# Patient Record
Sex: Male | Born: 1965 | Race: White | Hispanic: No | Marital: Single | Smoking: Never smoker
Health system: Southern US, Community
[De-identification: ages and names within clinical notes are randomized; demographics above are authoritative.]

## PROBLEM LIST (undated history)

## (undated) DIAGNOSIS — N200 Calculus of kidney: Secondary | ICD-10-CM

## (undated) DIAGNOSIS — R51 Headache: Secondary | ICD-10-CM

## (undated) DIAGNOSIS — J45909 Unspecified asthma, uncomplicated: Secondary | ICD-10-CM

## (undated) DIAGNOSIS — F32A Depression, unspecified: Secondary | ICD-10-CM

## (undated) DIAGNOSIS — C419 Malignant neoplasm of bone and articular cartilage, unspecified: Secondary | ICD-10-CM

## (undated) DIAGNOSIS — F419 Anxiety disorder, unspecified: Secondary | ICD-10-CM

## (undated) DIAGNOSIS — Z85830 Personal history of malignant neoplasm of bone: Secondary | ICD-10-CM

## (undated) DIAGNOSIS — R519 Headache, unspecified: Secondary | ICD-10-CM

## (undated) DIAGNOSIS — F329 Major depressive disorder, single episode, unspecified: Secondary | ICD-10-CM

## (undated) HISTORY — PX: LITHOTRIPSY: SUR834

## (undated) HISTORY — DX: Personal history of malignant neoplasm of bone: Z85.830

---

## 2004-12-25 DIAGNOSIS — C419 Malignant neoplasm of bone and articular cartilage, unspecified: Secondary | ICD-10-CM

## 2004-12-25 HISTORY — PX: APPENDECTOMY: SHX54

## 2004-12-25 HISTORY — DX: Malignant neoplasm of bone and articular cartilage, unspecified: C41.9

## 2007-06-17 ENCOUNTER — Emergency Department (HOSPITAL_COMMUNITY): Admission: EM | Admit: 2007-06-17 | Discharge: 2007-06-17 | Payer: Self-pay | Admitting: Emergency Medicine

## 2008-09-08 ENCOUNTER — Emergency Department (HOSPITAL_BASED_OUTPATIENT_CLINIC_OR_DEPARTMENT_OTHER): Admission: EM | Admit: 2008-09-08 | Discharge: 2008-09-08 | Payer: Self-pay | Admitting: Emergency Medicine

## 2008-10-26 ENCOUNTER — Emergency Department (HOSPITAL_BASED_OUTPATIENT_CLINIC_OR_DEPARTMENT_OTHER): Admission: EM | Admit: 2008-10-26 | Discharge: 2008-10-26 | Payer: Self-pay | Admitting: Emergency Medicine

## 2009-06-17 ENCOUNTER — Ambulatory Visit: Payer: Self-pay | Admitting: Diagnostic Radiology

## 2009-06-17 ENCOUNTER — Emergency Department (HOSPITAL_BASED_OUTPATIENT_CLINIC_OR_DEPARTMENT_OTHER): Admission: EM | Admit: 2009-06-17 | Discharge: 2009-06-17 | Payer: Self-pay | Admitting: Emergency Medicine

## 2009-09-04 ENCOUNTER — Emergency Department (HOSPITAL_BASED_OUTPATIENT_CLINIC_OR_DEPARTMENT_OTHER): Admission: EM | Admit: 2009-09-04 | Discharge: 2009-09-04 | Payer: Self-pay | Admitting: Emergency Medicine

## 2009-10-17 ENCOUNTER — Emergency Department (HOSPITAL_BASED_OUTPATIENT_CLINIC_OR_DEPARTMENT_OTHER): Admission: EM | Admit: 2009-10-17 | Discharge: 2009-10-17 | Payer: Self-pay | Admitting: Emergency Medicine

## 2009-10-17 ENCOUNTER — Ambulatory Visit: Payer: Self-pay | Admitting: Interventional Radiology

## 2009-12-25 DIAGNOSIS — N200 Calculus of kidney: Secondary | ICD-10-CM

## 2009-12-25 HISTORY — DX: Calculus of kidney: N20.0

## 2010-11-30 ENCOUNTER — Emergency Department (HOSPITAL_BASED_OUTPATIENT_CLINIC_OR_DEPARTMENT_OTHER)
Admission: EM | Admit: 2010-11-30 | Discharge: 2010-11-30 | Payer: Self-pay | Source: Home / Self Care | Admitting: Emergency Medicine

## 2010-12-08 ENCOUNTER — Ambulatory Visit (HOSPITAL_COMMUNITY)
Admission: RE | Admit: 2010-12-08 | Discharge: 2010-12-08 | Payer: Self-pay | Source: Home / Self Care | Attending: Urology | Admitting: Urology

## 2010-12-16 ENCOUNTER — Emergency Department (HOSPITAL_BASED_OUTPATIENT_CLINIC_OR_DEPARTMENT_OTHER)
Admission: EM | Admit: 2010-12-16 | Discharge: 2010-12-17 | Payer: Self-pay | Source: Home / Self Care | Admitting: Physician Assistant

## 2011-03-07 LAB — BASIC METABOLIC PANEL
BUN: 22 mg/dL (ref 6–23)
Creatinine, Ser: 1 mg/dL (ref 0.4–1.5)
Glucose, Bld: 149 mg/dL — ABNORMAL HIGH (ref 70–99)
Potassium: 4.2 mEq/L (ref 3.5–5.1)
Sodium: 144 mEq/L (ref 135–145)

## 2011-03-07 LAB — URINALYSIS, ROUTINE W REFLEX MICROSCOPIC
Ketones, ur: 15 mg/dL — AB
Protein, ur: 100 mg/dL — AB
Specific Gravity, Urine: 1.027 (ref 1.005–1.030)
Urobilinogen, UA: 0.2 mg/dL (ref 0.0–1.0)

## 2011-03-07 LAB — DIFFERENTIAL
Eosinophils Absolute: 0.1 10*3/uL (ref 0.0–0.7)
Eosinophils Relative: 1 % (ref 0–5)
Lymphocytes Relative: 12 % (ref 12–46)
Monocytes Absolute: 0.5 10*3/uL (ref 0.1–1.0)
Monocytes Relative: 5 % (ref 3–12)
Neutrophils Relative %: 82 % — ABNORMAL HIGH (ref 43–77)

## 2011-03-07 LAB — CBC
MCH: 29.5 pg (ref 26.0–34.0)
MCHC: 36.3 g/dL — ABNORMAL HIGH (ref 30.0–36.0)
Platelets: 252 10*3/uL (ref 150–400)

## 2011-03-07 LAB — URINE CULTURE
Colony Count: NO GROWTH
Culture: NO GROWTH

## 2011-03-07 LAB — URINE MICROSCOPIC-ADD ON

## 2011-03-30 LAB — DIFFERENTIAL
Eosinophils Absolute: 0.2 10*3/uL (ref 0.0–0.7)
Eosinophils Relative: 3 % (ref 0–5)
Lymphs Abs: 2.4 10*3/uL (ref 0.7–4.0)
Monocytes Relative: 6 % (ref 3–12)
Neutrophils Relative %: 58 % (ref 43–77)

## 2011-03-30 LAB — BASIC METABOLIC PANEL
BUN: 15 mg/dL (ref 6–23)
CO2: 23 mEq/L (ref 19–32)
Creatinine, Ser: 0.8 mg/dL (ref 0.4–1.5)
Glucose, Bld: 130 mg/dL — ABNORMAL HIGH (ref 70–99)
Potassium: 3.9 mEq/L (ref 3.5–5.1)

## 2011-03-30 LAB — CBC
MCHC: 34.3 g/dL (ref 30.0–36.0)
Platelets: 252 10*3/uL (ref 150–400)
RDW: 11.9 % (ref 11.5–15.5)

## 2011-04-12 ENCOUNTER — Emergency Department (INDEPENDENT_AMBULATORY_CARE_PROVIDER_SITE_OTHER): Payer: BC Managed Care – PPO

## 2011-04-12 ENCOUNTER — Emergency Department (HOSPITAL_BASED_OUTPATIENT_CLINIC_OR_DEPARTMENT_OTHER)
Admission: EM | Admit: 2011-04-12 | Discharge: 2011-04-12 | Disposition: A | Discharge status: Home / Self Care | Payer: BC Managed Care – PPO | Attending: Emergency Medicine | Admitting: Emergency Medicine

## 2011-04-12 DIAGNOSIS — R079 Chest pain, unspecified: Secondary | ICD-10-CM

## 2011-04-12 DIAGNOSIS — K219 Gastro-esophageal reflux disease without esophagitis: Secondary | ICD-10-CM | POA: Insufficient documentation

## 2011-04-12 LAB — BASIC METABOLIC PANEL
BUN: 16 mg/dL (ref 6–23)
CO2: 23 mEq/L (ref 19–32)
Chloride: 109 mEq/L (ref 96–112)
Creatinine, Ser: 0.8 mg/dL (ref 0.4–1.5)
GFR calc Af Amer: >60 mL/min (ref >60)
GFR calc non Af Amer: >60 mL/min (ref >60)

## 2011-04-12 LAB — POCT CARDIAC MARKERS
CKMB, poc: <1 ng/mL — ABNORMAL LOW (ref 1.0–8.0)
CKMB, poc: <1 ng/mL — ABNORMAL LOW (ref 1.0–8.0)
Myoglobin, poc: 38.9 ng/mL (ref 12–200)

## 2011-04-12 LAB — DIFFERENTIAL
Basophils Absolute: 0.1 10*3/uL (ref 0.0–0.1)
Basophils Relative: 1 % (ref 0–1)
Lymphs Abs: 2.5 10*3/uL (ref 0.7–4.0)
Monocytes Absolute: 0.6 10*3/uL (ref 0.1–1.0)
Monocytes Relative: 9 % (ref 3–12)
Neutro Abs: 3.4 10*3/uL (ref 1.7–7.7)
Neutrophils Relative %: 48 % (ref 43–77)

## 2011-04-12 LAB — CBC
HCT: 45.9 % (ref 39.0–52.0)
MCHC: 35.3 g/dL (ref 30.0–36.0)
MCV: 82.3 fL (ref 78.0–100.0)
WBC: 7.1 10*3/uL (ref 4.0–10.5)

## 2011-10-11 LAB — I-STAT 8, (EC8 V) (CONVERTED LAB)
Acid-base deficit: 1
Bicarbonate: 24.8 — ABNORMAL HIGH
HCT: 51
Operator id: 285841
TCO2: 26
pCO2, Ven: 42.3 — ABNORMAL LOW

## 2011-10-11 LAB — POCT CARDIAC MARKERS
CKMB, poc: <1 — ABNORMAL LOW
Troponin i, poc: <0.05

## 2011-10-11 LAB — POCT I-STAT CREATININE: Creatinine, Ser: 1

## 2014-05-01 ENCOUNTER — Emergency Department (HOSPITAL_COMMUNITY): Payer: BC Managed Care – PPO

## 2014-05-01 ENCOUNTER — Emergency Department (HOSPITAL_COMMUNITY)
Admission: EM | Admit: 2014-05-01 | Discharge: 2014-05-02 | Disposition: A | Discharge status: Home / Self Care | Payer: BC Managed Care – PPO | Attending: Emergency Medicine | Admitting: Emergency Medicine

## 2014-05-01 ENCOUNTER — Encounter (HOSPITAL_COMMUNITY): Payer: Self-pay | Admitting: Emergency Medicine

## 2014-05-01 DIAGNOSIS — Z85830 Personal history of malignant neoplasm of bone: Secondary | ICD-10-CM | POA: Insufficient documentation

## 2014-05-01 DIAGNOSIS — S46909A Unspecified injury of unspecified muscle, fascia and tendon at shoulder and upper arm level, unspecified arm, initial encounter: Secondary | ICD-10-CM | POA: Insufficient documentation

## 2014-05-01 DIAGNOSIS — S7010XA Contusion of unspecified thigh, initial encounter: Secondary | ICD-10-CM | POA: Insufficient documentation

## 2014-05-01 DIAGNOSIS — Z87442 Personal history of urinary calculi: Secondary | ICD-10-CM | POA: Insufficient documentation

## 2014-05-01 DIAGNOSIS — Y92410 Unspecified street and highway as the place of occurrence of the external cause: Secondary | ICD-10-CM | POA: Insufficient documentation

## 2014-05-01 DIAGNOSIS — Y9389 Activity, other specified: Secondary | ICD-10-CM | POA: Insufficient documentation

## 2014-05-01 DIAGNOSIS — R091 Pleurisy: Secondary | ICD-10-CM | POA: Insufficient documentation

## 2014-05-01 DIAGNOSIS — S4980XA Other specified injuries of shoulder and upper arm, unspecified arm, initial encounter: Secondary | ICD-10-CM | POA: Insufficient documentation

## 2014-05-01 HISTORY — DX: Malignant neoplasm of bone and articular cartilage, unspecified: C41.9

## 2014-05-01 HISTORY — DX: Calculus of kidney: N20.0

## 2014-05-01 NOTE — ED Notes (Signed)
Patient ambulatory from triage back to room--NAD at this time CXR and right femur imaging already completed
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
## 2014-05-02 LAB — I-STAT TROPONIN, ED: Troponin i, poc: 0 ng/mL (ref 0.00–0.08)

## 2014-05-02 LAB — D-DIMER, QUANTITATIVE: D-Dimer, Quant: <0.27 ug/mL-FEU (ref 0.00–0.48)

## 2014-05-02 NOTE — Discharge Instructions (Signed)
Please return to the ER if your chest pain or shortness of breath worsens.  In the meantime, Take ibuprofen with food, up to 800mg  three times a day, as needed for pain.  Apply a heating pad or ice pack.  Avoid activities that aggravate pain.     Pleurisy Pleurisy is an inflammation and swelling of the lining of the lungs (pleura). Because of this inflammation, it hurts to breathe. It can be aggravated by coughing, laughing, or deep breathing. Pleurisy is often caused by an underlying infection or disease.  HOME CARE INSTRUCTIONS  Monitor your pleurisy for any changes. The following actions may help to alleviate any discomfort you are experiencing:  Medicine may help with pain. Only take over-the-counter or prescription medicines for pain, discomfort, or fever as directed by your health care provider.  Only take antibiotic medicine as directed. Make sure to finish it even if you start to feel better. SEEK MEDICAL CARE IF:   Your pain is not controlled with medicine or is increasing.  You have an increase in pus-like (purulent) secretions brought up with coughing. SEEK IMMEDIATE MEDICAL CARE IF:   You have blue or dark lips, fingernails, or toenails.  You are coughing up blood.  You have increased difficulty breathing.  You have continuing pain unrelieved by medicine or pain lasting more than 1 week.  You have pain that radiates into your neck, arms, or jaw.  You develop increased shortness of breath or wheezing.  You develop a fever, rash, vomiting, fainting, or other serious symptoms. MAKE SURE YOU:  Understand these instructions.   Will watch your condition.   Will get help right away if you are not doing well or get worse.  Document Released: 12/11/2005 Document Revised: 08/13/2013 Document Reviewed: 05/25/2013 [facility] Patient Information 2014 [place].
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
## 2014-05-02 NOTE — ED Provider Notes (Signed)
Medical screening examination/treatment/procedure(s) were performed by non-physician practitioner and as supervising physician I was immediately available for consultation/collaboration.   EKG Interpretation   Date/Time:  Saturday May 02 2014 01:23:57 EDT Ventricular Rate:  54 PR Interval:  144 QRS Duration: 86 QT Interval:  424 QTC Calculation: 402 R Axis:   83 Text Interpretation:  Sinus bradycardia ST elev, probable normal early  repol pattern Rate is slower Confirmed by Florina Ou  MD, Jenny Reichmann ([id number]) on  05/02/2014 1:57:41 AM        Wynetta Fines, MD 05/02/14 [phone]

## 2015-01-09 IMAGING — CR DG CHEST 2V
2 series · 2 of 2 positions shown · non-contrast
Comparison: 04/12/2011

CLINICAL DATA: Fall.

EXAM:
CHEST  2 VIEW

[w chest pa]
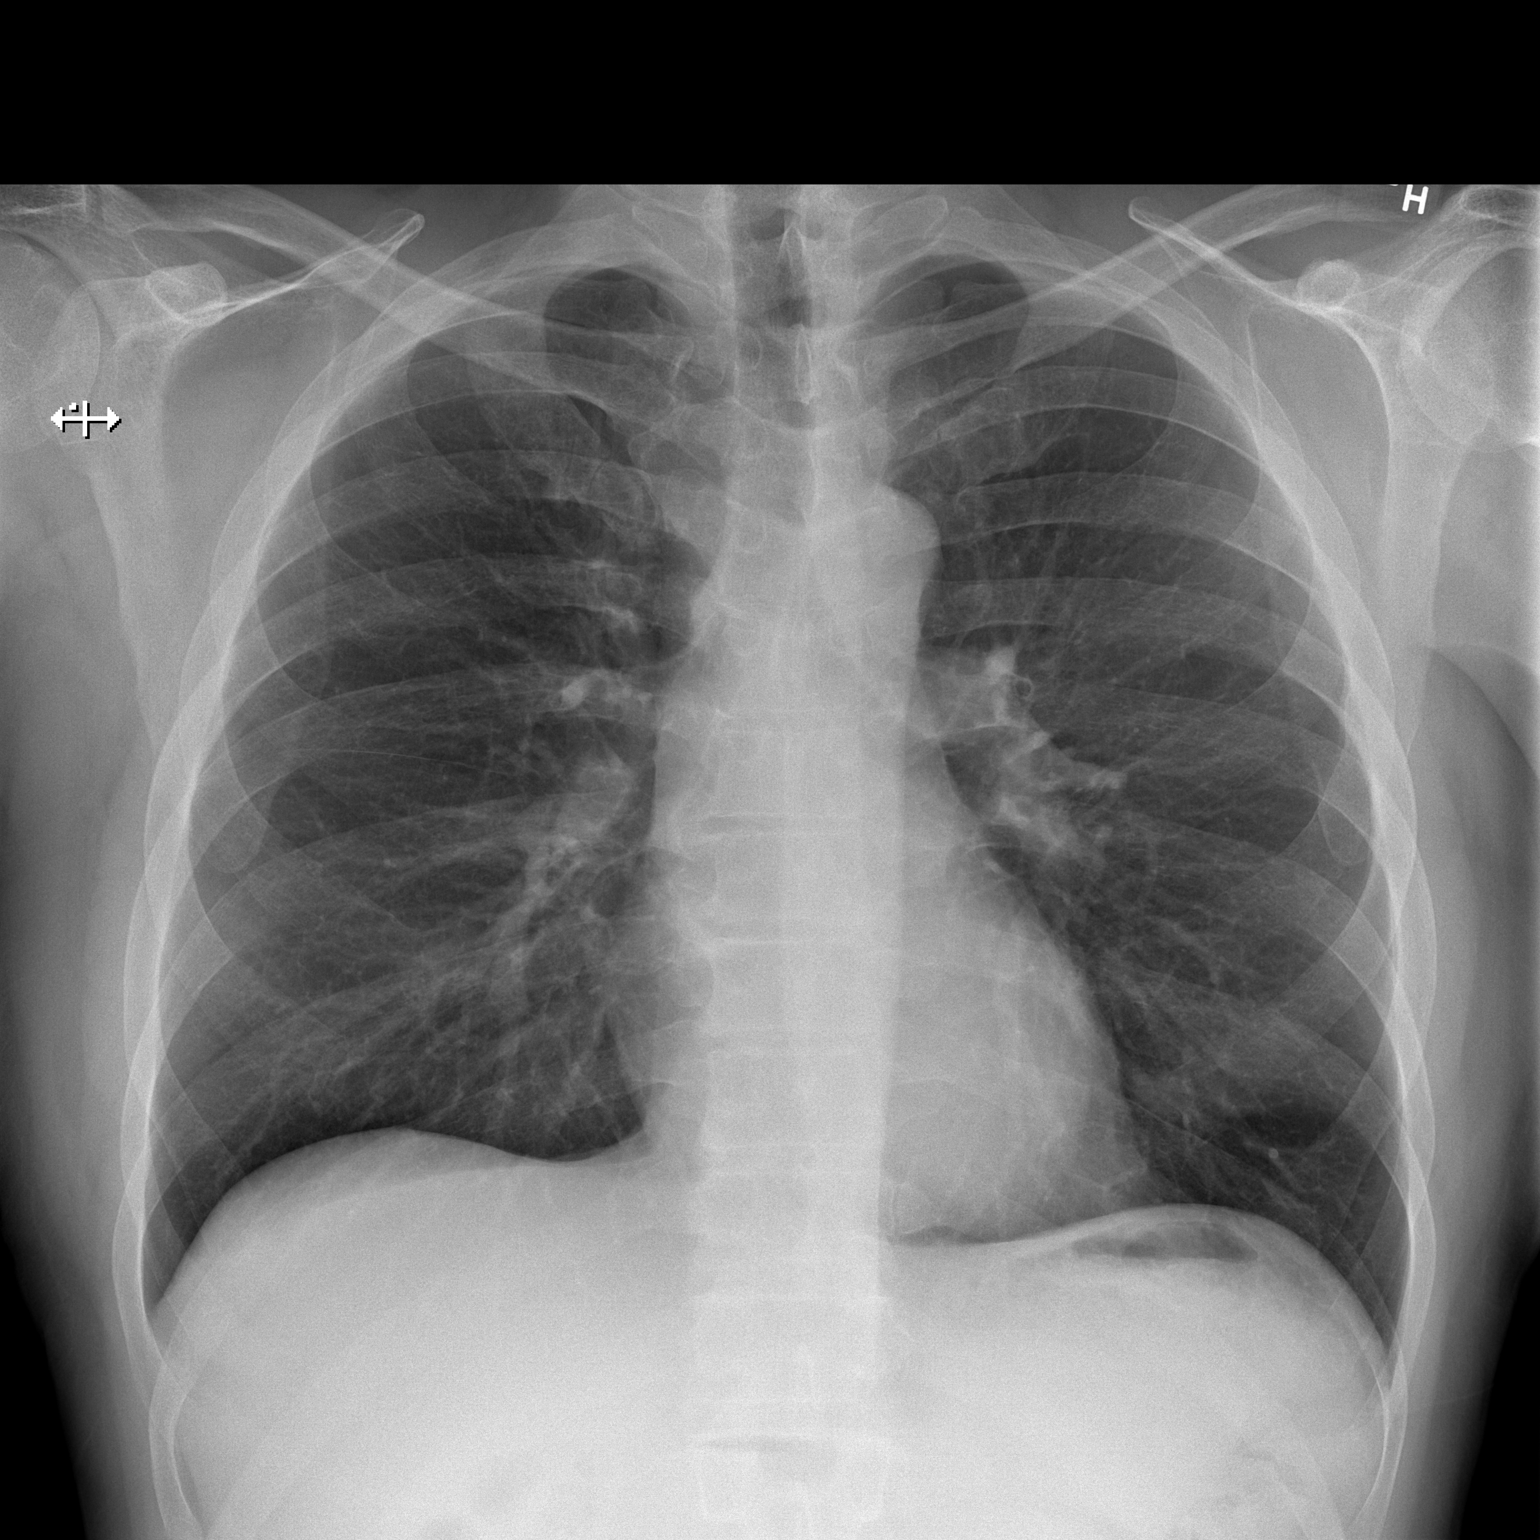

[w chest lat]
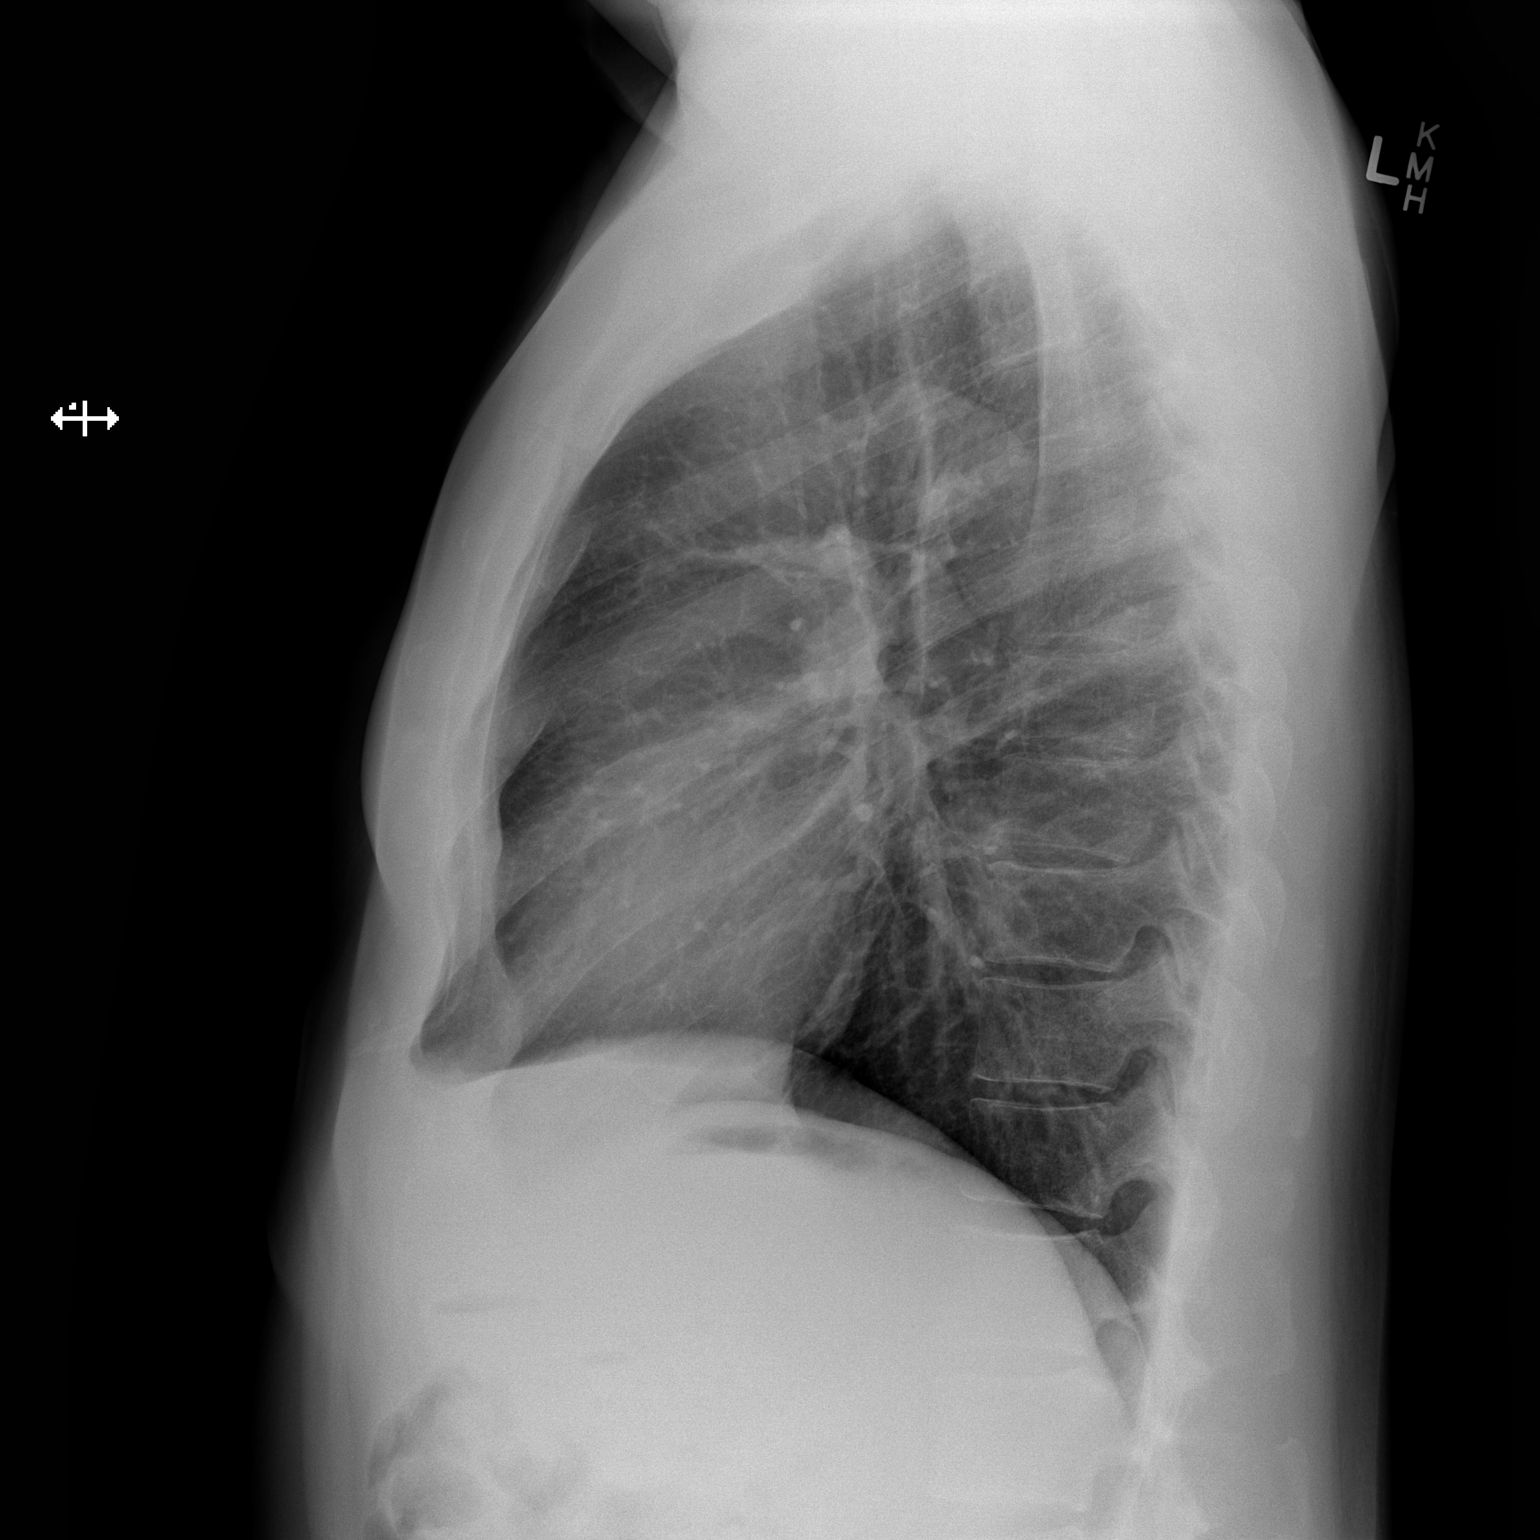

[2 of 2 positions shown; findings below may reference images not displayed]

FINDINGS: Normal heart size and mediastinal contours. No acute infiltrate or
edema. No effusion or pneumothorax. No acute osseous findings.
IMPRESSION: No active cardiopulmonary disease.

## 2015-03-15 ENCOUNTER — Other Ambulatory Visit: Payer: Self-pay | Admitting: Urology

## 2015-03-16 ENCOUNTER — Encounter (HOSPITAL_COMMUNITY): Payer: Self-pay | Admitting: *Deleted

## 2015-03-17 NOTE — H&P (Signed)
Chief Complaint Left flank pain and gross hematuria   History of Present Illness Jeffery Simpson is a 49 year old gentleman seen today as a new patient to me for a one-month history of left-sided flank pain with radiation to his left lower quadrant and left testis. This pain symptoms have been mild to moderate and controlled with ibuprofen. The pain episodes have been intermittent. They do feel fairly similar to his prior kidney stone episodes. He also has noted associated gross hematuria. He denies significant nausea or vomiting. He denies fever.    He has a history of calcium oxalate urolithiasis and previously has undergone ureteroscopy and shockwave lithotripsy for treatment. He also has spontaneously passed stones.   Past Medical History Problems  1. History of Asthma (J45.909) 2. History of Chondrosarcoma Of The Bone 3. History of esophageal reflux (Z87.19) 4. History of kidney stones (Q94.765)  Surgical History Problems  1. History of Appendectomy 2. History of Cystoscopy With Manipulation Of Ureteral Calculus 3. History of Lithotripsy 4. History of Orthopedic Surgery  Current Meds 1. BusPIRone HCl - 7.5 MG Oral Tablet;  Therapy: (Recorded:18Mar2016) to Recorded 2. Flexeril TABS (Cyclobenzaprine HCl);  Therapy: (Recorded:18Mar2016) to Recorded 3. Zoloft 100 MG Oral Tablet (Sertraline HCl);  Therapy: (Recorded:18Mar2016) to Recorded  Allergies Medication  1. Ciprofloxacin HCl TABS 2. Morphine Derivatives 3. Percocet TABS 4. Vicodin TABS  Family History Problems  1. Family history of malignant neoplasm of uterus (Z80.49) : Mother 2. Family history of Nephrolithiasis : Father 3. Family history of Prostate Cancer : Father  Social History Problems    Denied: History of Alcohol Use   Marital History - Single   Never a smoker   Occupation:   Marketing/Public relations   Denied: History of Tobacco Use  Review of Systems Genitourinary, constitutional, skin, eye,  otolaryngeal, hematologic/lymphatic, cardiovascular, pulmonary, endocrine, musculoskeletal, gastrointestinal, neurological and psychiatric system(s) were reviewed and pertinent findings if present are noted and are otherwise negative.  Gastrointestinal: no vomiting.  Constitutional: feeling tired (fatigue).  Musculoskeletal: back pain.  Neurological: headache and dizziness.  Psychiatric: anxiety and depression.    Vitals Vital Signs [Data Includes: Last 1 Day]  Recorded: [id number] 03:42PM  Height: 6 ft 2 in Weight: 194 lb  BMI Calculated: 24.91 BSA Calculated: 2.15 Blood Pressure: 157 / 104 Temperature: 98.2 F Heart Rate: 62  Physical Exam Constitutional: Well nourished and well developed . No acute distress.  ENT:. The ears and nose are normal in appearance.  Neck: The appearance of the neck is normal and no neck mass is present.  Pulmonary: No respiratory distress and normal respiratory rhythm and effort.  Cardiovascular: Heart rate and rhythm are normal . No peripheral edema.  Abdomen: The abdomen is soft and nontender. No masses are palpated. No CVA tenderness. No hernias are palpable. No hepatosplenomegaly noted.  Lymphatics: The femoral and inguinal nodes are not enlarged or tender.  Skin: Normal skin turgor, no visible rash and no visible skin lesions.  Neuro/Psych:. Mood and affect are appropriate.    Results/Data Urine [Data Includes: Last 1 Day]   [id number]  COLOR YELLOW   APPEARANCE CLEAR   SPECIFIC GRAVITY 1.025   pH 6.0   GLUCOSE NEG mg/dL  BILIRUBIN NEG   KETONE NEG mg/dL  BLOOD LARGE   PROTEIN NEG mg/dL  UROBILINOGEN 0.2 mg/dL  NITRITE NEG   LEUKOCYTE ESTERASE NEG   SQUAMOUS EPITHELIAL/HPF NONE SEEN   WBC NONE SEEN WBC/hpf  RBC 11-20 RBC/hpf  BACTERIA NONE SEEN   CRYSTALS NONE SEEN  CASTS NONE SEEN   Selected Results  AU CT-STONE PROTOCOL [id number] 12:00AM Raynelle Bring   Test Name Result Flag Reference  CT-STONE PROTOCOL (Report)    **  RADIOLOGY REPORT BY [facility] **   CLINICAL DATA: 49 year old male with gross and microscopic hematuria for 1 week.  EXAM: CT ABDOMEN AND PELVIS WITHOUT CONTRAST  TECHNIQUE: Multidetector CT imaging of the abdomen and pelvis was performed following the standard protocol without IV contrast.  COMPARISON: 11/30/2010 CT  FINDINGS: Please note that parenchymal abnormalities may be missed without intravenous contrast.  Hepatobiliary: The visualized liver and gallbladder are unremarkable. There is no evidence of biliary dilatation.  Pancreas: Unremarkable  Spleen: Unremarkable  Adrenals/Urinary Tract: A 4 mm proximal left ureteral calculus is noted without hydronephrosis.  Three punctate nonobstructing right renal calculi and a punctate mid left renal calculus are noted. A 3 mm right lower pole renal calculus is present. The kidneys are otherwise unremarkable. The bladder is unremarkable except for a small urachal remnant.  The adrenal glands are unremarkable  Stomach/Bowel: Unremarkable. There is no evidence of bowel obstruction.  Vascular/Lymphatic: No enlarged lymph nodes or abdominal aortic aneurysm.  Reproductive: The visualized prostate gland is unremarkable.  Other: No free fluid, pneumoperitoneum or focal collection.  Musculoskeletal: No acute or suspicious abnormalities are identified.  IMPRESSION: 4 mm nonobstructing proximal left ureteral calculus.  Nonobstructing bilateral renal calculi.   Electronically Signed  By: Margarette Canada M.D.  On: 03/12/2015 18:56    I independently reviewed his CT scan and his KUB x-ray.    His KUB x-ray was performed first and demonstrated a calcification in the vicinity of the proximal left ureter concerning for a possible ureteral calculus. This measured approximately 8 mm in longitudinal diameter. His CT scan confirmed a left proximal ureteral calculus without other ureteral calculi.  Assessment Assessed   1. Calculus of ureter (N20.1)  Plan Calculus of ureter  1. Start: HYDROmorphone HCl - 2 MG Oral Tablet (Dilaudid); TAKE 1 TABLET EVERY 4  HOURS AS NEEDED FOR PAIN 2. Start: Ondansetron HCl - 4 MG Oral Tablet; TAKE 1 TABLET Every 8 hours PRN 3. BASIC METABOLIC PANEL; Status:Hold For - Specimen/Data Collection,Appointment;  Requested for:18Mar2016;  4. Follow-up Office  Follow-up - will call to schedule surgery  Status: Hold For -  Appointment,Date of Service  Requested for: [id number] Health Maintenance  5. UA With REFLEX; [Do Not Release]; Status:Complete;   Done: [id number] 03:32PM  Discussion/Summary 1. Proximal left ureteral calculus: The stone is relatively low Hounsfield units likely consistent with a calcium oxalate dihydrate stone. He will plan to strain his urine and has been provided hydromorphone for pain prn as well as Zofran for nausea. He has been instructed to call should he develop uncontrollable pain, fever, or persistent nausea/vomiting. Considering that his pain has been present for about one month, I have recommended that he consider definitive treatment. We reviewed available options and alternatives. After discussion of all available treatment options, he has elected to proceed with left extracorporeal shockwave lithotripsy. We reviewed the potential risks, complications, and alternative options as well as the expected recovery process associated with this procedure. He is very familiar with this procedure having gone through it before. This will be scheduled for the near future.       Signatures Electronically signed by : Raynelle Bring, M.D.; Mar 12 2015  8:14PM EST

## 2015-03-18 ENCOUNTER — Ambulatory Visit (HOSPITAL_COMMUNITY)
Admission: RE | Admit: 2015-03-18 | Discharge: 2015-03-18 | Disposition: A | Discharge status: Home / Self Care | Payer: No Typology Code available for payment source | Source: Ambulatory Visit | Attending: Urology | Admitting: Urology

## 2015-03-18 ENCOUNTER — Encounter (HOSPITAL_COMMUNITY)
Admission: RE | Disposition: A | Discharge status: Home / Self Care | Payer: Self-pay | Source: Ambulatory Visit | Attending: Urology

## 2015-03-18 ENCOUNTER — Ambulatory Visit (HOSPITAL_COMMUNITY): Payer: No Typology Code available for payment source

## 2015-03-18 ENCOUNTER — Encounter (HOSPITAL_COMMUNITY): Payer: Self-pay | Admitting: General Practice

## 2015-03-18 DIAGNOSIS — Z79899 Other long term (current) drug therapy: Secondary | ICD-10-CM | POA: Diagnosis not present

## 2015-03-18 DIAGNOSIS — N201 Calculus of ureter: Secondary | ICD-10-CM | POA: Diagnosis present

## 2015-03-18 DIAGNOSIS — Z87442 Personal history of urinary calculi: Secondary | ICD-10-CM | POA: Diagnosis not present

## 2015-03-18 DIAGNOSIS — J45909 Unspecified asthma, uncomplicated: Secondary | ICD-10-CM | POA: Diagnosis not present

## 2015-03-18 DIAGNOSIS — Z85830 Personal history of malignant neoplasm of bone: Secondary | ICD-10-CM | POA: Insufficient documentation

## 2015-03-18 HISTORY — DX: Headache, unspecified: R51.9

## 2015-03-18 HISTORY — DX: Anxiety disorder, unspecified: F41.9

## 2015-03-18 HISTORY — DX: Unspecified asthma, uncomplicated: J45.909

## 2015-03-18 HISTORY — DX: Depression, unspecified: F32.A

## 2015-03-18 HISTORY — DX: Major depressive disorder, single episode, unspecified: F32.9

## 2015-03-18 HISTORY — DX: Headache: R51

## 2015-03-18 SURGERY — LITHOTRIPSY, ESWL
Anesthesia: LOCAL | Laterality: Left

## 2015-03-18 MED ORDER — DIAZEPAM 5 MG PO TABS
10.0000 mg | ORAL_TABLET | ORAL | Status: AC
  Administered 2015-03-18: 10 mg via ORAL
  Filled 2015-03-18: qty 2

## 2015-03-18 MED ORDER — DIPHENHYDRAMINE HCL 25 MG PO CAPS
25.0000 mg | ORAL_CAPSULE | ORAL | Status: AC
  Administered 2015-03-18: 25 mg via ORAL
  Filled 2015-03-18: qty 1

## 2015-03-18 MED ORDER — DEXTROSE-NACL 5-0.45 % IV SOLN
INTRAVENOUS | Status: DC
  Administered 2015-03-18: 12:00:00 via INTRAVENOUS

## 2015-03-18 MED ORDER — CEFAZOLIN SODIUM-DEXTROSE 2-3 GM-% IV SOLR
2.0000 g | Freq: Once | INTRAVENOUS | Status: AC
  Administered 2015-03-18: 2 g via INTRAVENOUS
  Filled 2015-03-18: qty 50

## 2015-03-18 NOTE — Interval H&P Note (Signed)
History and Physical Interval Note:  03/18/2015 3:36 PM  Jeffery Simpson  has presented today for surgery, with the diagnosis of LEFT URETERAL CALCULUS  The various methods of treatment have been discussed with the patient and family. After consideration of risks, benefits and other options for treatment, the patient has consented to  Procedure(s): LEFT EXTRACORPOREAL SHOCK WAVE LITHOTRIPSY (ESWL) (Left) as a surgical intervention .  The patient's history has been reviewed, patient examined, no change in status, stable for surgery.  I have reviewed the patient's chart and labs.  Questions were answered to the patient's satisfaction.     Aldene Hendon,LES

## 2015-03-18 NOTE — Op Note (Signed)
See scanned operative note for left ESWL.

## 2015-03-18 NOTE — Discharge Instructions (Signed)
1. You should strain your urine and collect all fragments and bring them to your follow up appointment.  °2. You should take your pain medication as needed.  Please call if your pain is severe to the point that it is not controlled with your pain medication. °3. You should call if you develop fever > 101 or persistent nausea or vomiting. °4. Your doctor may prescribe tamsulosin to take to help facilitate stone passage. °

## 2015-10-01 ENCOUNTER — Ambulatory Visit (INDEPENDENT_AMBULATORY_CARE_PROVIDER_SITE_OTHER): Payer: No Typology Code available for payment source | Admitting: Neurology

## 2015-10-01 ENCOUNTER — Encounter: Payer: Self-pay | Admitting: Neurology

## 2015-10-01 DIAGNOSIS — R269 Unspecified abnormalities of gait and mobility: Secondary | ICD-10-CM | POA: Diagnosis not present

## 2015-10-01 DIAGNOSIS — G43009 Migraine without aura, not intractable, without status migrainosus: Secondary | ICD-10-CM

## 2015-10-01 DIAGNOSIS — C419 Malignant neoplasm of bone and articular cartilage, unspecified: Secondary | ICD-10-CM

## 2015-10-01 DIAGNOSIS — G43909 Migraine, unspecified, not intractable, without status migrainosus: Secondary | ICD-10-CM | POA: Insufficient documentation

## 2015-10-01 DIAGNOSIS — R413 Other amnesia: Secondary | ICD-10-CM | POA: Insufficient documentation

## 2015-10-01 MED ORDER — BUSPIRONE HCL 7.5 MG PO TABS: 15.0000 mg | ORAL_TABLET | Freq: Two times a day (BID) | ORAL | Status: AC

## 2015-10-01 MED ORDER — CYCLOBENZAPRINE HCL 10 MG PO TABS: 10.0000 mg | ORAL_TABLET | Freq: Two times a day (BID) | ORAL | Status: AC | PRN

## 2015-10-01 NOTE — Progress Notes (Signed)
[facility]  PATIENT: Jeffery Simpson DOB: 11/24/1966  REFERRING DOCTOR OR PCP:  Lucianne Lei SOURCE: patient  _________________________________   HISTORICAL  CHIEF COMPLAINT:  Chief Complaint  Patient presents with  . Gait Disturbance    Sts. onset of intermittent dizziness, gait and balance disturbance about 3 mos. ago.  Denies dizziness today.  Sts. onset of difficulty with memory about 6 mos. ago.  An example would be that he forgets where he parked his car.  Sts. he has had migraines since he was a child, and sts. he still has his  normal h/a's, but sts. about 5-6 mos. ago he also began having a new type of h/a that he would like to discuss.  He also c/o tremors only in left thumb onset about 6 mos. ago.  Also sts. that twice in   . Dizziness    last 6 mos. he has "smelled odors that shouldn't be there"--was walking in the woods and smelled strong odor similar to what burning brakes smell like.  Sts. hx. of cancer, so is concerned these sx. may represent a reoccurrance/mets./fim  . Memory Loss    HISTORY OF PRESENT ILLNESS:  I had the pleasure seeing you patient, Jeffery Simpson, at [facility] for neurologic consultation regarding his gait disturbance, dizziness and memory loss.    He is a 49 year old man who had the onset of the symptoms earlier this year.     He has a long history of migraine without aura.   These are unilateral associated with nausea, photophobia and photophobia.    Moving increases these headaches.    Fioricet helps.     More recently, since February or March, he has had a duller ache type of headache on the left only.   This headache occurs weekly or less now but used to occur several times a week.    Red wine trigger a migraine. Triptan's have not helped.    Fioricet helps the most.     He has had some intermittent episodes of poor balance but no persistent symptom.   As an example while showering, he had sudden  feeling of being pushed one way and he needed to hold on.     He has had several episodes of shaking in his hand.  This lasts about 10 minutes and then is better.  He has been more forgetful the past few months.   He notes not being able to find his car in parking lots a few times.     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   He sees his oncologist annually and has had no recurrence.     He reports moderate anxiety and mild depression.    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      He notes sleep maintenance insomnia.    He tried Ambien once but woke up and felt very strange.    Trazodone had not helped.    Cyclobenzaprine helps some.        Montreal Cognitive Assessment  10/01/2015  Visuospatial/ Executive (0/5) 5  Naming (0/3) 3  Attention: Read list of digits (0/2) 1  Attention: Read list of letters (0/1) 1  Attention: Serial 7 subtraction starting at 100 (0/3) 3  Language: Repeat phrase (0/2) 2  Language : Fluency (0/1)  1  Abstraction (0/2) 2  Delayed Recall (0/5) 2  Orientation (0/6) 6  Total 26  Adjusted Score (based on education) 26     REVIEW OF SYSTEMS: Constitutional:   No chills, sweats, or change in appetite Eyes: No visual changes, double vision, eye pain Ear, nose and throat: No hearing loss, ear pain, nasal congestion, sore throat Cardiovascular: No chest pain, palpitations Respiratory: No shortness of breath at rest or with exertion.   No wheezes GastrointestinaI: No nausea, vomiting, diarrhea, abdominal pain, fecal incontinence Genitourinary: No dysuria, urinary retention or frequency.  No nocturia. Musculoskeletal: No neck pain, back pain Integumentary: No rash, pruritus, skin lesions Neurological: as above Psychiatric: No depression at this time.  No anxiety Endocrine: No palpitations, diaphoresis,  change in appetite, change in weigh or increased thirst Hematologic/Lymphatic: No anemia, purpura, petechiae. Allergic/Immunologic: No itchy/runny eyes, nasal congestion, recent allergic reactions, rashes  ALLERGIES: Allergies  Allergen Reactions  . Ciprofloxacin Other (See Comments)    Cipro caused him not to be able to walk  . Percocet [Oxycodone-Acetaminophen] Nausea And Vomiting    Headache.   . Vicodin [Hydrocodone-Acetaminophen] Nausea And Vomiting  . Morphine And Related Itching, Nausea And Vomiting and Rash    He feels like his head will explode    HOME MEDICATIONS:  Current outpatient prescriptions:  .  busPIRone (BUSPAR) 7.5 MG tablet, Take 2 tablets (15 mg total) by mouth 2 (two) times daily., Disp: 60 tablet, Rfl: 5 .  butalbital-aspirin-caffeine-codeine (BUTALBITAL COMPOUND/CODEINE) 50-325-40-30 MG capsule, Take by mouth., Disp: , Rfl:  .  cyclobenzaprine (FLEXERIL) 10 MG tablet, Take 1 tablet (10 mg total) by mouth 2 (two) times daily as needed (at night and at the onset of a headache)., Disp: 60 tablet, Rfl: 5 .  fluticasone (FLONASE) 50 MCG/ACT nasal spray, Place 2 sprays into both nostrils every morning., Disp: , Rfl:  .  sertraline (ZOLOFT) 100 MG tablet, Take 100 mg by mouth every morning., Disp: , Rfl:   PAST MEDICAL HISTORY: Past Medical History  Diagnosis Date  . Bone cancer ([place]) 2006  . Kidney stone 2011  . Asthma   . Depression   . Anxiety   . Headache   . H/O chondrosarcoma approx. 10 yrs. ago    left humerus    PAST SURGICAL HISTORY: Past Surgical History  Procedure Laterality Date  . Appendectomy  2006  . Lithotripsy      FAMILY HISTORY: History reviewed. No pertinent family history.  SOCIAL HISTORY:  Social History   Social History  . Marital Status: Single    Spouse Name: N/A  . Number of Children: N/A  . Years of Education: N/A   Occupational History  . Not on file.   Social History Main Topics  . Smoking status: Never  Smoker   . Smokeless tobacco: Never Used  . Alcohol Use: Yes     Comment: rarely   . Drug Use: No  . Sexual Activity: No   Other Topics Concern  . Not on file   Social History Narrative     PHYSICAL EXAM  There were no vitals filed for this visit.  There is no weight on file to calculate BMI.   General: The patient is well-developed and well-nourished and in no acute distress  Eyes:  Funduscopic exam shows normal optic discs and retinal vessels.  Neck: The neck is supple, no carotid bruits are noted.  The neck is nontender.  Cardiovascular: The heart has a regular rate and rhythm with  a normal S1 and S2. There were no murmurs, gallops or rubs.   Skin: Extremities are without significant edema.  Musculoskeletal:  Back is nontender  Neurologic Exam  Mental status: The patient is alert and oriented x 3 at the time of the examination. The patient has apparent normal recent and remote memory, with an apparently normal attention span and concentration ability.   Speech is normal.  Cranial nerves: Extraocular movements are full. Pupils are equal, round, and reactive to light and accomodation.  Visual fields are full.  Facial symmetry is present. There is good facial sensation to soft touch bilaterally.Facial strength is normal.  Trapezius and sternocleidomastoid strength is normal. No dysarthria is noted.  The tongue is midline, and the patient has symmetric elevation of the soft palate. No obvious hearing deficits are noted.  Motor:  Muscle bulk is normal.   Tone is normal. Strength is  5 / 5 in all 4 extremities.   Sensory: Sensory testing is intact to pinprick, soft touch and vibration sensation in all 4 extremities.  Coordination: Cerebellar testing reveals good finger-nose-finger and heel-to-shin bilaterally.  Gait and station: Station is normal.   Gait is normal. Tandem gait is normal. Romberg is negative.   Reflexes: Deep tendon reflexes are symmetric and normal  bilaterally.   Plantar responses are flexor.    DIAGNOSTIC DATA (LABS, IMAGING, TESTING) - I reviewed patient records, labs, notes, testing and imaging myself where available.  Lab Results  Component Value Date   WBC 7.1 04/12/2011   HGB 16.2 04/12/2011   HCT 45.9 04/12/2011   MCV 82.3 04/12/2011   PLT 238 04/12/2011      Component Value Date/Time   NA 144 04/12/2011 0757   K 4.6 04/12/2011 0757   CL 109 04/12/2011 0757   CO2 23 04/12/2011 0757   GLUCOSE 97 04/12/2011 0757   BUN 16 04/12/2011 0757   CREATININE .8 04/12/2011 0757   CALCIUM 8.8 04/12/2011 0757   GFRNONAA >60 04/12/2011 0757   GFRAA  04/12/2011 0757    >60        The eGFR has been calculated using the MDRD equation. This calculation has not been validated in all clinical situations. eGFR's persistently <60 mL/min signify possible Chronic Kidney Disease.       ASSESSMENT AND PLAN  Memory disturbance - Plan: MR Brain W Wo Contrast, TSH, T4, Sedimentation rate, Vitamin B12  Migraine without aura and without status migrainosus, not intractable  Gait disturbance - Plan: MR Brain W Wo Contrast  Chondrosarcoma (HCC) - Plan: MR Brain W Wo Contrast   In summary, Tajh Livsey is a 49 year old man with predominantly intermittent neurologic symptoms who has a history of chondrosarcoma in the past. I discussed with him that most likely his mild cognitive problems are due to decreased focus and that would be most consistent with the [facility] cognitive assessment test that we did. However as he has had some other symptoms and he has a history of cancer, it is essential that we check an MRI of the brain with and without contrast to rule out a pathologic processes. Based on the results, further evaluation may be necessary. He, I will check TSH, T4, sedimentation rate and B12 to look for other possible causes of his symptoms. To help with his insomnia, I will have him take Flexeril every single night additionally,  he had anxiety and I will double his BuSpar to 15 mg by mouth twice a day. He will return to see me  in 2 or 3 months or sooner if there are new or worsening neurologic symptoms.   Nitza Schmid A. Felecia Shelling, MD, PhD 32/06/6146, 09:29 PM Certified in Neurology, Clinical Neurophysiology, Sleep Medicine, Pain Medicine and Neuroimaging  [facility] [address] [place], [place] [address] [phone]

## 2015-10-02 LAB — T4: T4 TOTAL: 7.7 ug/dL (ref 4.5–12.0)

## 2015-10-02 LAB — VITAMIN B12: VITAMIN B 12: 557 pg/mL (ref 211–946)

## 2015-10-02 LAB — TSH: TSH: 1.03 u[IU]/mL (ref 0.450–4.500)

## 2015-10-02 LAB — SEDIMENTATION RATE: Sed Rate: 9 mm/hr (ref 0–30)

## 2015-10-13 ENCOUNTER — Ambulatory Visit (INDEPENDENT_AMBULATORY_CARE_PROVIDER_SITE_OTHER): Payer: No Typology Code available for payment source

## 2015-10-13 DIAGNOSIS — C419 Malignant neoplasm of bone and articular cartilage, unspecified: Secondary | ICD-10-CM

## 2015-10-13 DIAGNOSIS — R269 Unspecified abnormalities of gait and mobility: Secondary | ICD-10-CM

## 2015-10-13 DIAGNOSIS — R413 Other amnesia: Secondary | ICD-10-CM

## 2015-10-13 MED ORDER — GADOPENTETATE DIMEGLUMINE 469.01 MG/ML IV SOLN: 18.0000 mL | Freq: Once | INTRAVENOUS | Status: AC | PRN

## 2015-10-18 ENCOUNTER — Telehealth: Payer: Self-pay | Admitting: *Deleted

## 2015-10-18 NOTE — Telephone Encounter (Signed)
I have spoken with Jeffery Simpson this morning and per RAS, advised that mri brain is normal.  He verbalized understanding of same/fim

## 2015-10-18 NOTE — Telephone Encounter (Signed)
-----   Message from Britt Bottom, MD sent at 10/15/2015  4:10 PM EDT ----- Please note that the MRI of the brain is normal.

## 2015-10-27 ENCOUNTER — Other Ambulatory Visit: Payer: No Typology Code available for payment source

## 2015-11-26 IMAGING — CR DG ABDOMEN 1V
1 series · 1 of 1 positions shown · non-contrast
Comparison: CT urogram of 03/12/2015

CLINICAL DATA: Preop for left ureteral calculus

EXAM:
ABDOMEN - 1 VIEW

[t abdomen supine]
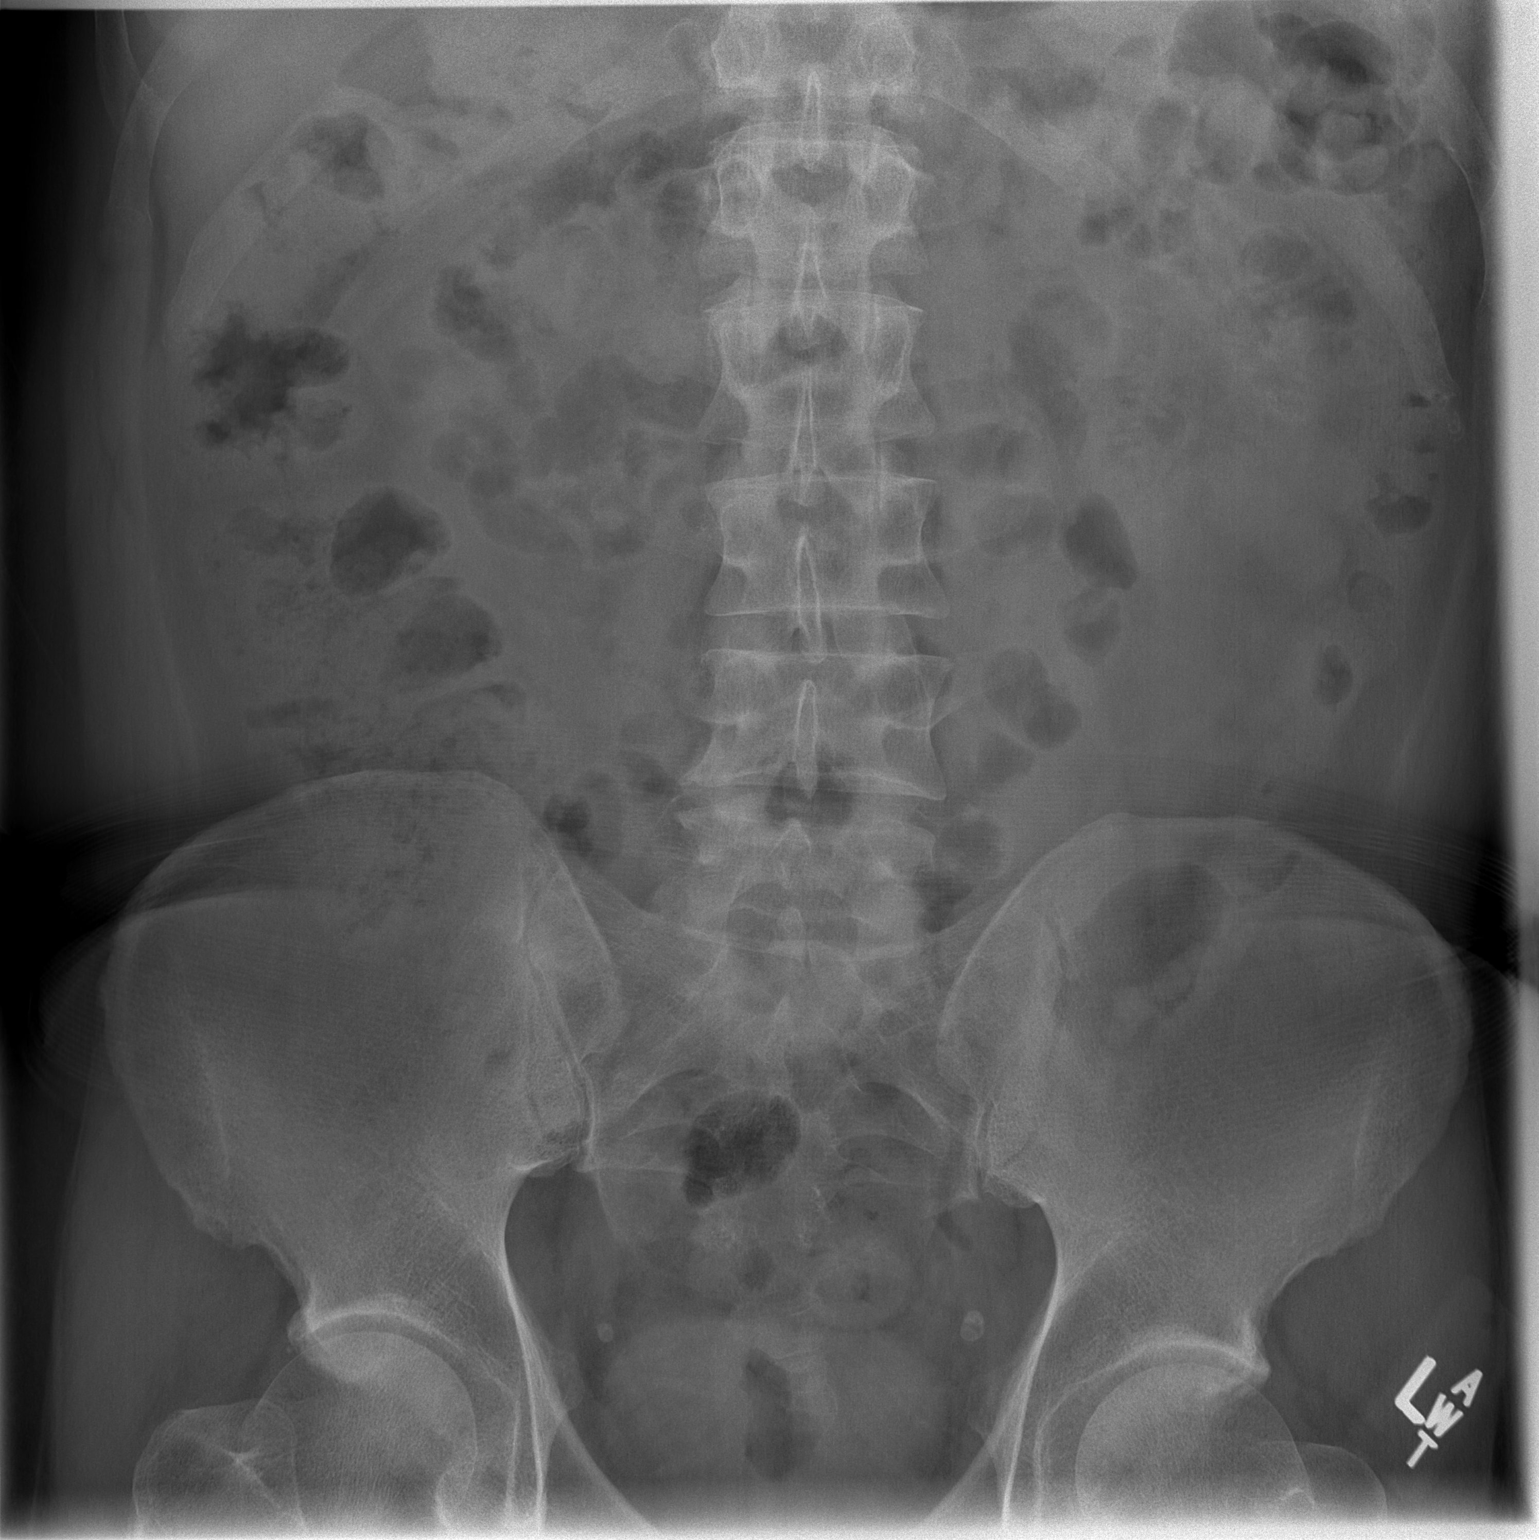

[1 of 1 positions shown; findings below may reference images not displayed]

FINDINGS: Compared to the prior CT, the proximal left ureteral calculus
appears to have moved more distally although that calculus is very
difficult to see by plain film. I believe this calculus overlies the
transverse process on the left of L5 vertebral body. No other
definite calculi are seen by plain film. The bowel gas pattern is
nonspecific.
IMPRESSION: The left ureteral calculus appears to have moved more distally now
overlying the left transverse process of L5.

## 2016-01-03 ENCOUNTER — Ambulatory Visit: Payer: No Typology Code available for payment source | Admitting: Neurology

## 2016-02-03 ENCOUNTER — Ambulatory Visit: Payer: No Typology Code available for payment source | Admitting: Neurology

## 2016-03-02 ENCOUNTER — Ambulatory Visit: Payer: No Typology Code available for payment source | Admitting: Neurology

## 2016-03-29 ENCOUNTER — Ambulatory Visit (HOSPITAL_COMMUNITY)
Admission: EM | Admit: 2016-03-29 | Discharge: 2016-03-29 | Disposition: A | Discharge status: Home / Self Care | Payer: BLUE CROSS/BLUE SHIELD | Attending: Family Medicine | Admitting: Family Medicine

## 2016-03-29 ENCOUNTER — Encounter (HOSPITAL_COMMUNITY): Payer: Self-pay | Admitting: *Deleted

## 2016-03-29 DIAGNOSIS — J029 Acute pharyngitis, unspecified: Secondary | ICD-10-CM | POA: Diagnosis present

## 2016-03-29 DIAGNOSIS — F419 Anxiety disorder, unspecified: Secondary | ICD-10-CM | POA: Insufficient documentation

## 2016-03-29 DIAGNOSIS — J45909 Unspecified asthma, uncomplicated: Secondary | ICD-10-CM | POA: Diagnosis not present

## 2016-03-29 DIAGNOSIS — Z888 Allergy status to other drugs, medicaments and biological substances status: Secondary | ICD-10-CM | POA: Insufficient documentation

## 2016-03-29 DIAGNOSIS — J020 Streptococcal pharyngitis: Secondary | ICD-10-CM | POA: Insufficient documentation

## 2016-03-29 DIAGNOSIS — Z87442 Personal history of urinary calculi: Secondary | ICD-10-CM | POA: Insufficient documentation

## 2016-03-29 DIAGNOSIS — Z79899 Other long term (current) drug therapy: Secondary | ICD-10-CM | POA: Diagnosis not present

## 2016-03-29 DIAGNOSIS — Z9889 Other specified postprocedural states: Secondary | ICD-10-CM | POA: Insufficient documentation

## 2016-03-29 DIAGNOSIS — F329 Major depressive disorder, single episode, unspecified: Secondary | ICD-10-CM | POA: Diagnosis not present

## 2016-03-29 DIAGNOSIS — Z7982 Long term (current) use of aspirin: Secondary | ICD-10-CM | POA: Diagnosis not present

## 2016-03-29 LAB — POCT RAPID STREP A: Streptococcus, Group A Screen (Direct): NEGATIVE

## 2016-03-29 MED ORDER — CEFDINIR 300 MG PO CAPS: 300.0000 mg | ORAL_CAPSULE | Freq: Two times a day (BID) | ORAL | Status: AC

## 2016-03-29 NOTE — ED Notes (Signed)
Pt  Reports symptoms  Of  sorethroat  To  Include  Pain  When  He  Swallows       And  Some swelling  In  Pain  l side  Of  His  Neck  Radiating  To  His l  Ear      Pt  Reports    Symptoms  Not  releived  By otc meds   And  ibuprophen

## 2016-03-29 NOTE — ED Provider Notes (Signed)
CSN: [id number]     Arrival date & time 03/29/16  1558 History   First MD Initiated Contact with Patient 03/29/16 1744     Chief Complaint  Patient presents with  . Sore Throat   (Consider location/radiation/quality/duration/timing/severity/associated sxs/prior Treatment) Patient is a 50 y.o. male presenting with pharyngitis. The history is provided by the patient.  Sore Throat This is a new problem. The current episode started more than 2 days ago. The problem has been gradually worsening. The symptoms are aggravated by swallowing.    Past Medical History  Diagnosis Date  . Bone cancer ([place]) 2006  . Kidney stone 2011  . Asthma   . Depression   . Anxiety   . Headache   . H/O chondrosarcoma approx. 10 yrs. ago    left humerus   Past Surgical History  Procedure Laterality Date  . Appendectomy  2006  . Lithotripsy     History reviewed. No pertinent family history. Social History  Substance Use Topics  . Smoking status: Never Smoker   . Smokeless tobacco: Never Used  . Alcohol Use: Yes     Comment: rarely     Review of Systems  Constitutional: Positive for chills. Negative for fever.  HENT: Positive for ear pain, postnasal drip, rhinorrhea and sore throat.   Respiratory: Negative.   Cardiovascular: Negative.   Hematological: Negative for adenopathy.  All other systems reviewed and are negative.   Allergies  Ciprofloxacin; Percocet; Vicodin; and Morphine and related  Home Medications   Prior to Admission medications   Medication Sig Start Date End Date Taking? Authorizing Provider  busPIRone (BUSPAR) 7.5 MG tablet Take 2 tablets (15 mg total) by mouth 2 (two) times daily. 10/01/15   Britt Bottom, MD  butalbital-aspirin-caffeine-codeine (BUTALBITAL COMPOUND/CODEINE) [id number] MG capsule Take by mouth.    Historical Provider, MD  cefdinir (OMNICEF) 300 MG capsule Take 1 capsule (300 mg total) by mouth 2 (two) times daily. 03/29/16   Billy Fischer, MD   cyclobenzaprine (FLEXERIL) 10 MG tablet Take 1 tablet (10 mg total) by mouth 2 (two) times daily as needed (at night and at the onset of a headache). 10/01/15   Britt Bottom, MD  fluticasone (FLONASE) 50 MCG/ACT nasal spray Place 2 sprays into both nostrils every morning.    Historical Provider, MD  sertraline (ZOLOFT) 100 MG tablet Take 100 mg by mouth every morning.    Historical Provider, MD   Meds Ordered and Administered this Visit  Medications - No data to display  BP 142/97 mmHg  Pulse 78  Temp(Src) 98.6 F (37 C)  Resp 18  SpO2 100% No data found.   Physical Exam  Constitutional: He is oriented to person, place, and time. He appears well-developed and well-nourished. No distress.  HENT:  Right Ear: External ear normal.  Left Ear: External ear normal.  Mouth/Throat: Uvula is midline and mucous membranes are normal. Posterior oropharyngeal edema and posterior oropharyngeal erythema present. No oropharyngeal exudate.  Eyes: Conjunctivae are normal. Pupils are equal, round, and reactive to light.  Neck: Normal range of motion. Neck supple.  Cardiovascular: Normal rate, normal heart sounds and intact distal pulses.   Pulmonary/Chest: Effort normal and breath sounds normal.  Abdominal: Soft. Bowel sounds are normal.  Lymphadenopathy:    He has cervical adenopathy.  Neurological: He is alert and oriented to person, place, and time.  Skin: Skin is warm and dry.  Nursing note and vitals reviewed.   ED Course  Procedures (including  critical care time)  Labs Review Labs Reviewed  POCT RAPID STREP A    Imaging Review No results found.   Visual Acuity Review  Right Eye Distance:   Left Eye Distance:   Bilateral Distance:    Right Eye Near:   Left Eye Near:    Bilateral Near:         MDM   1. Streptococcal sore throat        Billy Fischer, MD 03/29/16 [phone]

## 2016-03-31 LAB — CULTURE, GROUP A STREP (THRC)

## 2016-04-05 ENCOUNTER — Telehealth (HOSPITAL_COMMUNITY): Payer: Self-pay | Admitting: Emergency Medicine

## 2016-04-05 NOTE — ED Notes (Signed)
Called pt and notified of recent lab results from visit 4/5 Pt ID'd properly... Reports feeling better and sx have subsided... Still taking antibiotics  Per Dr. Valere Dross,  Clinical staff, please let patient know that throat cx was positive for non group A strep. Received rx cefdinir at [facility] visit 03/29/16. Finish cefdinir. Recheck or followup pcp/Veita Bland for persistent sx's. LM  Adv pt if sx are not getting better to return  Pt verb understanding

## 2016-12-13 ENCOUNTER — Other Ambulatory Visit: Payer: Self-pay | Admitting: Neurology

## 2019-10-30 ENCOUNTER — Other Ambulatory Visit: Payer: Self-pay

## 2019-10-30 DIAGNOSIS — Z20822 Contact with and (suspected) exposure to covid-19: Secondary | ICD-10-CM

## 2019-11-01 LAB — NOVEL CORONAVIRUS, NAA: SARS-CoV-2, NAA: NOT DETECTED

## 2022-10-09 ENCOUNTER — Other Ambulatory Visit (HOSPITAL_BASED_OUTPATIENT_CLINIC_OR_DEPARTMENT_OTHER): Payer: Self-pay

## 2022-10-09 MED ORDER — SEMAGLUTIDE-WEIGHT MANAGEMENT 1 MG/0.5ML ~~LOC~~ SOAJ
1.0000 mg | SUBCUTANEOUS | 0 refills | Status: AC
  Filled 2022-10-10 – 2023-08-29 (×2): qty 2, 28d supply, fill #0

## 2022-10-10 ENCOUNTER — Other Ambulatory Visit (HOSPITAL_BASED_OUTPATIENT_CLINIC_OR_DEPARTMENT_OTHER): Payer: Self-pay

## 2022-10-11 ENCOUNTER — Other Ambulatory Visit (HOSPITAL_BASED_OUTPATIENT_CLINIC_OR_DEPARTMENT_OTHER): Payer: Self-pay

## 2022-10-17 ENCOUNTER — Other Ambulatory Visit (HOSPITAL_BASED_OUTPATIENT_CLINIC_OR_DEPARTMENT_OTHER): Payer: Self-pay

## 2022-10-17 MED ORDER — MOUNJARO 7.5 MG/0.5ML ~~LOC~~ SOAJ
SUBCUTANEOUS | 2 refills | Status: AC
  Filled 2022-10-18: qty 2, 28d supply, fill #0
  Filled 2022-11-24: qty 2, 28d supply, fill #1
  Filled 2023-01-15: qty 2, 28d supply, fill #2
  Filled 2023-04-09 – 2023-04-10 (×3): qty 2, 28d supply, fill #3
  Filled 2023-10-15: qty 2, 28d supply, fill #4

## 2022-10-18 ENCOUNTER — Other Ambulatory Visit (HOSPITAL_BASED_OUTPATIENT_CLINIC_OR_DEPARTMENT_OTHER): Payer: Self-pay

## 2022-10-20 ENCOUNTER — Other Ambulatory Visit (HOSPITAL_BASED_OUTPATIENT_CLINIC_OR_DEPARTMENT_OTHER): Payer: Self-pay

## 2022-10-21 ENCOUNTER — Other Ambulatory Visit (HOSPITAL_BASED_OUTPATIENT_CLINIC_OR_DEPARTMENT_OTHER): Payer: Self-pay

## 2022-11-24 ENCOUNTER — Other Ambulatory Visit (HOSPITAL_BASED_OUTPATIENT_CLINIC_OR_DEPARTMENT_OTHER): Payer: Self-pay

## 2023-01-15 ENCOUNTER — Other Ambulatory Visit (HOSPITAL_BASED_OUTPATIENT_CLINIC_OR_DEPARTMENT_OTHER): Payer: Self-pay

## 2023-01-19 ENCOUNTER — Other Ambulatory Visit (HOSPITAL_BASED_OUTPATIENT_CLINIC_OR_DEPARTMENT_OTHER): Payer: Self-pay

## 2023-01-20 ENCOUNTER — Other Ambulatory Visit (HOSPITAL_BASED_OUTPATIENT_CLINIC_OR_DEPARTMENT_OTHER): Payer: Self-pay

## 2023-01-24 ENCOUNTER — Other Ambulatory Visit (HOSPITAL_BASED_OUTPATIENT_CLINIC_OR_DEPARTMENT_OTHER): Payer: Self-pay

## 2023-01-26 ENCOUNTER — Other Ambulatory Visit (HOSPITAL_BASED_OUTPATIENT_CLINIC_OR_DEPARTMENT_OTHER): Payer: Self-pay

## 2023-02-04 ENCOUNTER — Other Ambulatory Visit (HOSPITAL_BASED_OUTPATIENT_CLINIC_OR_DEPARTMENT_OTHER): Payer: Self-pay

## 2023-02-05 ENCOUNTER — Other Ambulatory Visit (HOSPITAL_BASED_OUTPATIENT_CLINIC_OR_DEPARTMENT_OTHER): Payer: Self-pay

## 2023-02-07 ENCOUNTER — Other Ambulatory Visit (HOSPITAL_BASED_OUTPATIENT_CLINIC_OR_DEPARTMENT_OTHER): Payer: Self-pay

## 2023-04-09 ENCOUNTER — Other Ambulatory Visit (HOSPITAL_BASED_OUTPATIENT_CLINIC_OR_DEPARTMENT_OTHER): Payer: Self-pay

## 2023-04-10 ENCOUNTER — Other Ambulatory Visit (HOSPITAL_BASED_OUTPATIENT_CLINIC_OR_DEPARTMENT_OTHER): Payer: Self-pay

## 2023-04-10 MED ORDER — CYCLOBENZAPRINE HCL 10 MG PO TABS
10.0000 mg | ORAL_TABLET | Freq: Two times a day (BID) | ORAL | 3 refills | Status: AC
  Filled 2023-04-19: qty 60, 30d supply, fill #0

## 2023-04-10 MED ORDER — ENALAPRIL-HYDROCHLOROTHIAZIDE 10-25 MG PO TABS: 1.0000 | ORAL_TABLET | Freq: Every morning | ORAL | 0 refills | Status: DC

## 2023-04-11 ENCOUNTER — Other Ambulatory Visit (HOSPITAL_BASED_OUTPATIENT_CLINIC_OR_DEPARTMENT_OTHER): Payer: Self-pay

## 2023-04-19 ENCOUNTER — Other Ambulatory Visit (HOSPITAL_BASED_OUTPATIENT_CLINIC_OR_DEPARTMENT_OTHER): Payer: Self-pay

## 2023-04-19 MED ORDER — AMLODIPINE BESYLATE 5 MG PO TABS
5.0000 mg | ORAL_TABLET | Freq: Every day | ORAL | 1 refills | Status: AC
  Filled 2023-04-19 – 2023-06-04 (×2): qty 30, 30d supply, fill #0

## 2023-04-20 ENCOUNTER — Other Ambulatory Visit (HOSPITAL_BASED_OUTPATIENT_CLINIC_OR_DEPARTMENT_OTHER): Payer: Self-pay

## 2023-04-25 ENCOUNTER — Other Ambulatory Visit (HOSPITAL_BASED_OUTPATIENT_CLINIC_OR_DEPARTMENT_OTHER): Payer: Self-pay

## 2023-04-27 ENCOUNTER — Other Ambulatory Visit (HOSPITAL_BASED_OUTPATIENT_CLINIC_OR_DEPARTMENT_OTHER): Payer: Self-pay

## 2023-04-27 MED ORDER — BISACODYL 5 MG PO TBEC
DELAYED_RELEASE_TABLET | ORAL | 0 refills | Status: AC
  Filled 2023-04-27: qty 100, 100d supply, fill #0

## 2023-04-30 ENCOUNTER — Other Ambulatory Visit (HOSPITAL_BASED_OUTPATIENT_CLINIC_OR_DEPARTMENT_OTHER): Payer: Self-pay

## 2023-04-30 MED ORDER — GLYCERIN (ADULT) 2 G RE SUPP
RECTAL | 0 refills | Status: AC
  Filled 2023-04-30: qty 12, 12d supply, fill #0

## 2023-06-04 ENCOUNTER — Other Ambulatory Visit (HOSPITAL_BASED_OUTPATIENT_CLINIC_OR_DEPARTMENT_OTHER): Payer: Self-pay

## 2023-06-14 ENCOUNTER — Other Ambulatory Visit (HOSPITAL_BASED_OUTPATIENT_CLINIC_OR_DEPARTMENT_OTHER): Payer: Self-pay

## 2023-06-18 ENCOUNTER — Other Ambulatory Visit (HOSPITAL_BASED_OUTPATIENT_CLINIC_OR_DEPARTMENT_OTHER): Payer: Self-pay

## 2023-06-18 MED ORDER — ENALAPRIL-HYDROCHLOROTHIAZIDE 10-25 MG PO TABS
1.0000 | ORAL_TABLET | Freq: Every morning | ORAL | 0 refills | Status: AC
  Filled 2023-06-18: qty 30, 30d supply, fill #0
  Filled 2023-08-03: qty 30, 30d supply, fill #1
  Filled 2023-10-11: qty 30, 30d supply, fill #2

## 2023-06-21 ENCOUNTER — Other Ambulatory Visit (HOSPITAL_BASED_OUTPATIENT_CLINIC_OR_DEPARTMENT_OTHER): Payer: Self-pay

## 2023-06-29 ENCOUNTER — Other Ambulatory Visit (HOSPITAL_BASED_OUTPATIENT_CLINIC_OR_DEPARTMENT_OTHER): Payer: Self-pay

## 2023-08-03 ENCOUNTER — Other Ambulatory Visit (HOSPITAL_BASED_OUTPATIENT_CLINIC_OR_DEPARTMENT_OTHER): Payer: Self-pay

## 2023-08-29 ENCOUNTER — Other Ambulatory Visit (HOSPITAL_BASED_OUTPATIENT_CLINIC_OR_DEPARTMENT_OTHER): Payer: Self-pay

## 2023-10-11 ENCOUNTER — Other Ambulatory Visit (HOSPITAL_BASED_OUTPATIENT_CLINIC_OR_DEPARTMENT_OTHER): Payer: Self-pay

## 2023-10-15 ENCOUNTER — Other Ambulatory Visit (HOSPITAL_BASED_OUTPATIENT_CLINIC_OR_DEPARTMENT_OTHER): Payer: Self-pay

## 2023-10-17 ENCOUNTER — Other Ambulatory Visit (HOSPITAL_BASED_OUTPATIENT_CLINIC_OR_DEPARTMENT_OTHER): Payer: Self-pay

## 2023-11-05 ENCOUNTER — Other Ambulatory Visit (HOSPITAL_BASED_OUTPATIENT_CLINIC_OR_DEPARTMENT_OTHER): Payer: Self-pay

## 2023-11-06 ENCOUNTER — Other Ambulatory Visit (HOSPITAL_BASED_OUTPATIENT_CLINIC_OR_DEPARTMENT_OTHER): Payer: Self-pay

## 2024-04-29 ENCOUNTER — Other Ambulatory Visit (HOSPITAL_BASED_OUTPATIENT_CLINIC_OR_DEPARTMENT_OTHER): Payer: Self-pay

## 2024-04-29 MED ORDER — ENALAPRIL-HYDROCHLOROTHIAZIDE 10-25 MG PO TABS
1.0000 | ORAL_TABLET | Freq: Every morning | ORAL | 3 refills | Status: AC
  Filled 2024-04-29: qty 90, 90d supply, fill #0
  Filled 2024-07-28 – 2024-08-14 (×2): qty 90, 90d supply, fill #1
  Filled 2024-11-27: qty 90, 90d supply, fill #2

## 2024-04-29 MED ORDER — BUTALBITAL-ASPIRIN-CAFFEINE 50-325-40 MG PO CAPS
1.0000 | ORAL_CAPSULE | Freq: Four times a day (QID) | ORAL | 3 refills | Status: DC | PRN
  Filled 2024-04-29: qty 60, 15d supply, fill #0

## 2024-04-29 MED ORDER — CYCLOBENZAPRINE HCL 10 MG PO TABS
10.0000 mg | ORAL_TABLET | Freq: Every evening | ORAL | 1 refills | Status: AC | PRN
  Filled 2024-04-30: qty 60, 60d supply, fill #0
  Filled 2024-07-28: qty 60, 60d supply, fill #1

## 2024-04-30 ENCOUNTER — Other Ambulatory Visit (HOSPITAL_BASED_OUTPATIENT_CLINIC_OR_DEPARTMENT_OTHER): Payer: Self-pay

## 2024-04-30 ENCOUNTER — Other Ambulatory Visit (HOSPITAL_COMMUNITY): Payer: Self-pay

## 2024-04-30 ENCOUNTER — Other Ambulatory Visit: Payer: Self-pay

## 2024-05-01 ENCOUNTER — Other Ambulatory Visit (HOSPITAL_BASED_OUTPATIENT_CLINIC_OR_DEPARTMENT_OTHER): Payer: Self-pay

## 2024-05-02 ENCOUNTER — Other Ambulatory Visit (HOSPITAL_BASED_OUTPATIENT_CLINIC_OR_DEPARTMENT_OTHER): Payer: Self-pay

## 2024-05-20 ENCOUNTER — Other Ambulatory Visit (HOSPITAL_BASED_OUTPATIENT_CLINIC_OR_DEPARTMENT_OTHER): Payer: Self-pay

## 2024-06-07 ENCOUNTER — Other Ambulatory Visit (HOSPITAL_BASED_OUTPATIENT_CLINIC_OR_DEPARTMENT_OTHER): Payer: Self-pay

## 2024-07-28 ENCOUNTER — Other Ambulatory Visit (HOSPITAL_BASED_OUTPATIENT_CLINIC_OR_DEPARTMENT_OTHER): Payer: Self-pay

## 2024-08-07 ENCOUNTER — Other Ambulatory Visit (HOSPITAL_BASED_OUTPATIENT_CLINIC_OR_DEPARTMENT_OTHER): Payer: Self-pay

## 2024-08-14 ENCOUNTER — Other Ambulatory Visit (HOSPITAL_BASED_OUTPATIENT_CLINIC_OR_DEPARTMENT_OTHER): Payer: Self-pay
